# Patient Record
Sex: Female | Born: 2005 | Hispanic: No | Marital: Single | Smoking: Current some day smoker
Health system: Southern US, Community
[De-identification: ages and names within clinical notes are randomized; demographics above are authoritative.]

---
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
## 2023-02-22 ENCOUNTER — Ambulatory Visit (HOSPITAL_COMMUNITY): Payer: MEDICAID | Admitting: Clinical

## 2023-03-17 ENCOUNTER — Telehealth (HOSPITAL_COMMUNITY): Payer: MEDICAID | Admitting: Psychiatry

## 2024-05-26 ENCOUNTER — Emergency Department (HOSPITAL_BASED_OUTPATIENT_CLINIC_OR_DEPARTMENT_OTHER)
Admission: EM | Admit: 2024-05-26 | Discharge: 2024-05-27 | Disposition: A | Discharge status: Home / Self Care | Payer: MEDICAID | Attending: Emergency Medicine | Admitting: Emergency Medicine

## 2024-05-26 ENCOUNTER — Other Ambulatory Visit: Payer: Self-pay

## 2024-05-26 ENCOUNTER — Encounter (HOSPITAL_BASED_OUTPATIENT_CLINIC_OR_DEPARTMENT_OTHER): Payer: Self-pay | Admitting: Emergency Medicine

## 2024-05-26 DIAGNOSIS — D72829 Elevated white blood cell count, unspecified: Secondary | ICD-10-CM | POA: Insufficient documentation

## 2024-05-26 DIAGNOSIS — R112 Nausea with vomiting, unspecified: Secondary | ICD-10-CM | POA: Diagnosis present

## 2024-05-26 LAB — CBC WITH DIFFERENTIAL/PLATELET
Abs Immature Granulocytes: 0.09 K/uL — ABNORMAL HIGH (ref 0.00–0.07)
Basophils Absolute: 0.1 K/uL (ref 0.0–0.1)
Basophils Relative: 1 %
Eosinophils Absolute: 0 K/uL (ref 0.0–0.5)
Eosinophils Relative: 0 %
HCT: 40.7 % (ref 36.0–46.0)
Hemoglobin: 13.5 g/dL (ref 12.0–15.0)
Immature Granulocytes: 1 %
Lymphocytes Relative: 16 %
Lymphs Abs: 2.1 K/uL (ref 0.7–4.0)
MCH: 26.3 pg (ref 26.0–34.0)
MCHC: 33.2 g/dL (ref 30.0–36.0)
MCV: 79.3 fL — ABNORMAL LOW (ref 80.0–100.0)
Monocytes Absolute: 1.1 K/uL — ABNORMAL HIGH (ref 0.1–1.0)
Monocytes Relative: 8 %
Neutro Abs: 10.2 K/uL — ABNORMAL HIGH (ref 1.7–7.7)
Neutrophils Relative %: 74 %
Platelets: 401 K/uL — ABNORMAL HIGH (ref 150–400)
RBC: 5.13 MIL/uL — ABNORMAL HIGH (ref 3.87–5.11)
RDW: 15.7 % — ABNORMAL HIGH (ref 11.5–15.5)
WBC: 13.5 K/uL — ABNORMAL HIGH (ref 4.0–10.5)
nRBC: 0 % (ref 0.0–0.2)

## 2024-05-26 LAB — HCG, SERUM, QUALITATIVE: Preg, Serum: NEGATIVE

## 2024-05-26 MED ORDER — KETOROLAC TROMETHAMINE 30 MG/ML IJ SOLN
30.0000 mg | Freq: Once | INTRAMUSCULAR | Status: AC
  Administered 2024-05-26: 30 mg via INTRAVENOUS
  Filled 2024-05-26: qty 1

## 2024-05-26 MED ORDER — SODIUM CHLORIDE 0.9 % IV BOLUS
1000.0000 mL | Freq: Once | INTRAVENOUS | Status: AC
  Administered 2024-05-26: 1000 mL via INTRAVENOUS

## 2024-05-26 MED ORDER — ONDANSETRON HCL 4 MG/2ML IJ SOLN
4.0000 mg | Freq: Once | INTRAMUSCULAR | Status: AC
  Administered 2024-05-26: 4 mg via INTRAVENOUS
  Filled 2024-05-26: qty 2

## 2024-05-26 NOTE — ED Provider Notes (Signed)
 [place] [facility] Provider Note   CSN: [id number] Arrival date & time: 05/26/24  2225     Patient presents with: Medication Reaction   Erin Simpson is a 18 y.o. female.   Patient is a 18 year old female accompanied by her mother presenting with complaints of nausea, vomiting, and abdominal discomfort.  Symptoms started several hours prior to presentation.  Symptoms began with a headache, then she began to feel queasy, developed a sour taste in her mouth, then vomited.  She describes abdominal cramping that comes and goes.  There has been no diarrhea.  She denies any fevers or chills.  No ill contacts and denies having consumed any undercooked or suspicious foods.  No aggravating or alleviating factors.  She does report starting a higher dose of a psychiatric medication recently.       Prior to Admission medications   Medication Sig Start Date End Date Taking? Authorizing Provider  atomoxetine  (STRATTERA ) 40 MG capsule Take 1 capsule (40 mg total) by mouth daily. 12/22/22   Harl Zane BRAVO, NP  mirtazapine  (REMERON ) 15 MG tablet Take 1 tablet (15 mg total) by mouth at bedtime. 12/22/22   Harl Zane BRAVO, NP  paliperidone  (INVEGA ) 6 MG 24 hr tablet Take 1 tablet (6 mg total) by mouth daily. 12/22/22   Harl Zane BRAVO, NP    Allergies: Food    Review of Systems  All other systems reviewed and are negative.   Updated Vital Signs BP (!) 140/86 (BP Location: Right Arm)   Pulse (!) 121   Temp 97.7 F (36.5 C)   Resp 18   Ht 5' 6 (1.676 m)   Wt 90.7 kg   SpO2 98%   BMI 32.28 kg/m   Physical Exam Vitals and nursing note reviewed.  Constitutional:      General: She is not in acute distress.    Appearance: She is well-developed. She is not diaphoretic.  HENT:     Head: Normocephalic and atraumatic.  Cardiovascular:     Rate and Rhythm: Normal rate and regular rhythm.     Heart sounds: No murmur heard.    No friction rub. No  gallop.  Pulmonary:     Effort: Pulmonary effort is normal. No respiratory distress.     Breath sounds: Normal breath sounds. No wheezing.  Abdominal:     General: Bowel sounds are normal. There is no distension.     Palpations: Abdomen is soft.     Tenderness: There is no abdominal tenderness.  Musculoskeletal:        General: Normal range of motion.     Cervical back: Normal range of motion and neck supple.  Skin:    General: Skin is warm and dry.  Neurological:     General: No focal deficit present.     Mental Status: She is alert and oriented to person, place, and time.     (all labs ordered are listed, but only abnormal results are displayed) Labs Reviewed  COMPREHENSIVE METABOLIC PANEL WITH GFR  CBC WITH DIFFERENTIAL/PLATELET  LIPASE, BLOOD  HCG, SERUM, QUALITATIVE    EKG: None  Radiology: No results found.   Procedures   Medications Ordered in the ED  sodium chloride 0.9 % bolus 1,000 mL (has no administration in time range)  ondansetron (ZOFRAN) injection 4 mg (has no administration in time range)  ketorolac (TORADOL) 30 MG/ML injection 30 mg (has no administration in time range)  Medical Decision Making Amount and/or Complexity of Data Reviewed Labs: ordered.  Risk Prescription drug management.   Patient is a 18 year old female presenting with nausea and vomiting as described in the HPI.  Patient arrives here with stable vital signs and is afebrile.  Physical examination reveals a benign abdomen and patient to be clinically well-appearing.  Laboratory studies obtained including CBC, CMP, and lipase.  White count is 13.5, but laboratory studies otherwise unremarkable.  Pregnancy test is negative.  Patient has been hydrated with normal saline and given Zofran for nausea and Toradol for cramping and seems to be feeling significantly improved.  I suspect a viral or foodborne etiology and believe the symptoms to be  self-limited.  She will be discharged with Zofran and clear liquids.  To return as needed if symptoms worsen or change.  I have considered other etiologies such as appendicitis or ovarian torsion, but neither fit the clinical picture and patient has a benign abdomen.     Final diagnoses:  None    ED Discharge Orders     None          Geroldine Berg, MD 05/27/24 0100

## 2024-05-26 NOTE — ED Triage Notes (Signed)
 Pt states reaction to new medication about 1 hour ago. Started with mouth numbness then emesis. Most Sx resolved except abdominal pain.

## 2024-05-27 LAB — COMPREHENSIVE METABOLIC PANEL WITH GFR
ALT: 16 U/L (ref 0–44)
AST: 27 U/L (ref 15–41)
Albumin: 4.4 g/dL (ref 3.5–5.0)
Alkaline Phosphatase: 92 U/L (ref 38–126)
Anion gap: 15 (ref 5–15)
BUN: 7 mg/dL (ref 6–20)
CO2: 22 mmol/L (ref 22–32)
Calcium: 9.5 mg/dL (ref 8.9–10.3)
Chloride: 102 mmol/L (ref 98–111)
Creatinine, Ser: 0.63 mg/dL (ref 0.44–1.00)
GFR, Estimated: >60 mL/min (ref >60)
Glucose, Bld: 99 mg/dL (ref 70–99)
Potassium: 3.9 mmol/L (ref 3.5–5.1)
Sodium: 139 mmol/L (ref 135–145)
Total Bilirubin: 0.3 mg/dL (ref 0.0–1.2)
Total Protein: 8.3 g/dL — ABNORMAL HIGH (ref 6.5–8.1)

## 2024-05-27 LAB — LIPASE, BLOOD: Lipase: 14 U/L (ref 11–51)

## 2024-05-27 MED ORDER — ONDANSETRON 8 MG PO TBDP: ORAL_TABLET | ORAL | 0 refills | Status: AC

## 2024-05-27 NOTE — Discharge Instructions (Addendum)
 Begin taking Zofran as prescribed.  Clear liquids for the next 12 hours, then slowly advance diet as tolerated.  Return to the ER if you develop severe abdominal pain, bloody vomit or stool, high fevers, or for other new and concerning symptoms.
